# Patient Record
Sex: Male | Born: 1960 | Race: White | Hispanic: No | Marital: Married | Smoking: Former smoker
Health system: Southern US, Community
[De-identification: ages and names within clinical notes are randomized; demographics above are authoritative.]

## PROBLEM LIST (undated history)

## (undated) DIAGNOSIS — R202 Paresthesia of skin: Secondary | ICD-10-CM

## (undated) DIAGNOSIS — B0223 Postherpetic polyneuropathy: Secondary | ICD-10-CM

## (undated) DIAGNOSIS — R200 Anesthesia of skin: Secondary | ICD-10-CM

## (undated) DIAGNOSIS — L0390 Cellulitis, unspecified: Secondary | ICD-10-CM

## (undated) DIAGNOSIS — J329 Chronic sinusitis, unspecified: Secondary | ICD-10-CM

## (undated) DIAGNOSIS — G459 Transient cerebral ischemic attack, unspecified: Secondary | ICD-10-CM

## (undated) HISTORY — DX: Paresthesia of skin: R20.2

## (undated) HISTORY — PX: OTHER SURGICAL HISTORY: SHX169

## (undated) HISTORY — DX: Chronic sinusitis, unspecified: J32.9

## (undated) HISTORY — DX: Anesthesia of skin: R20.0

## (undated) HISTORY — DX: Postherpetic polyneuropathy: B02.23

## (undated) HISTORY — DX: Cellulitis, unspecified: L03.90

## (undated) HISTORY — DX: Transient cerebral ischemic attack, unspecified: G45.9

---

## 2007-01-03 ENCOUNTER — Ambulatory Visit: Payer: Self-pay | Admitting: Internal Medicine

## 2007-02-19 ENCOUNTER — Ambulatory Visit: Payer: Self-pay | Admitting: Internal Medicine

## 2007-05-10 ENCOUNTER — Encounter: Payer: Self-pay | Admitting: Family Medicine

## 2007-05-10 ENCOUNTER — Ambulatory Visit: Payer: Self-pay | Admitting: Internal Medicine

## 2007-05-11 ENCOUNTER — Ambulatory Visit (HOSPITAL_COMMUNITY): Admission: RE | Admit: 2007-05-11 | Discharge: 2007-05-11 | Payer: Self-pay | Admitting: Internal Medicine

## 2007-07-06 ENCOUNTER — Encounter (INDEPENDENT_AMBULATORY_CARE_PROVIDER_SITE_OTHER): Payer: Self-pay | Admitting: Diagnostic Radiology

## 2007-07-06 ENCOUNTER — Ambulatory Visit (HOSPITAL_COMMUNITY): Admission: RE | Admit: 2007-07-06 | Discharge: 2007-07-06 | Payer: Self-pay | Admitting: Internal Medicine

## 2007-12-23 IMAGING — US US ABDOMEN COMPLETE
1 series · 14 of 25 positions shown · non-contrast
Comparison: none

HISTORY: Transaminitis, elevated LFTs

ULTRASOUND ABDOMEN COMPLETE:
TECHNIQUE: Complete abdominal ultrasound examination was performed including
evaluation of the liver, gallbladder, bile ducts, pancreas, kidneys, spleen,
IVC, and abdominal aorta.
Gallbladder normal without stones or wall thickening.
Common bile duct normal caliber, 3 mm diameter.
Liver, pancreas, and spleen normal appearance, spleen measuring 9.7 cm length.
Kidneys normal appearance, right 11.8 cm length and left 11.4 cm.
Aorta and IVC normal.
No ascites.

[Series 1: us abdomen complete · 0.32mm/px · 14 of 60 slices shown]
[im 1/60]
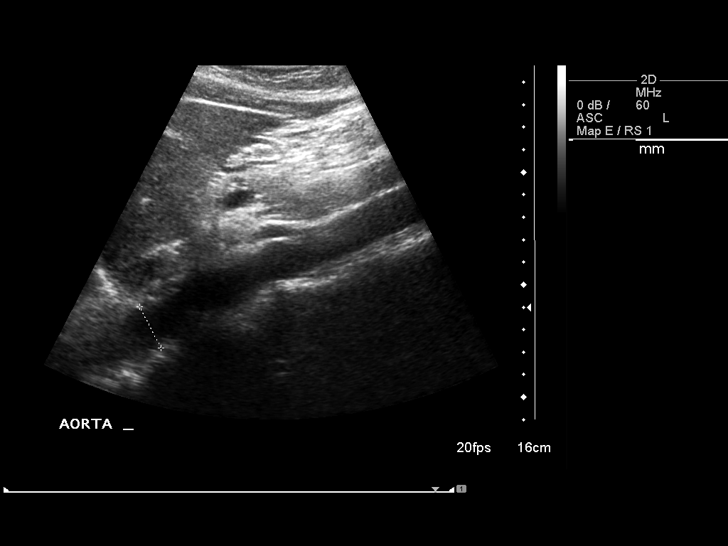
[im 5/60]
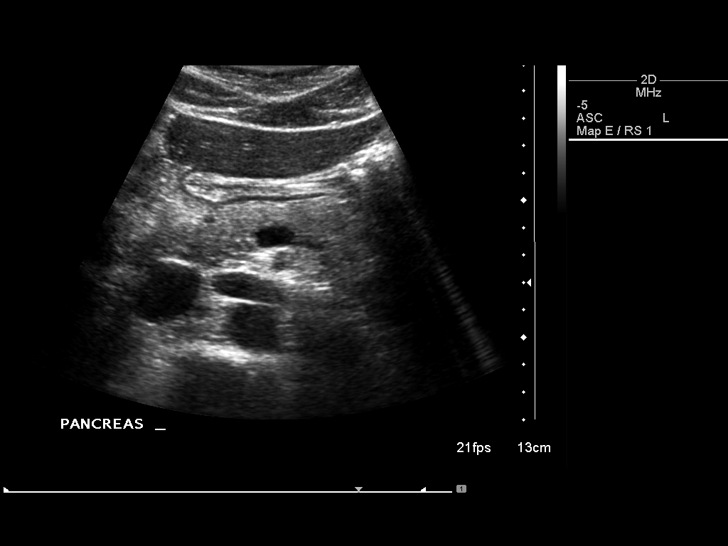
[im 10/60]
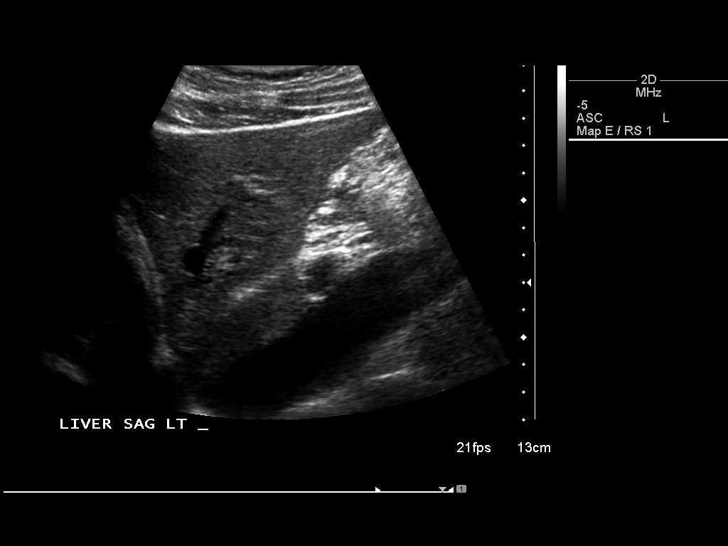
[im 15/60]
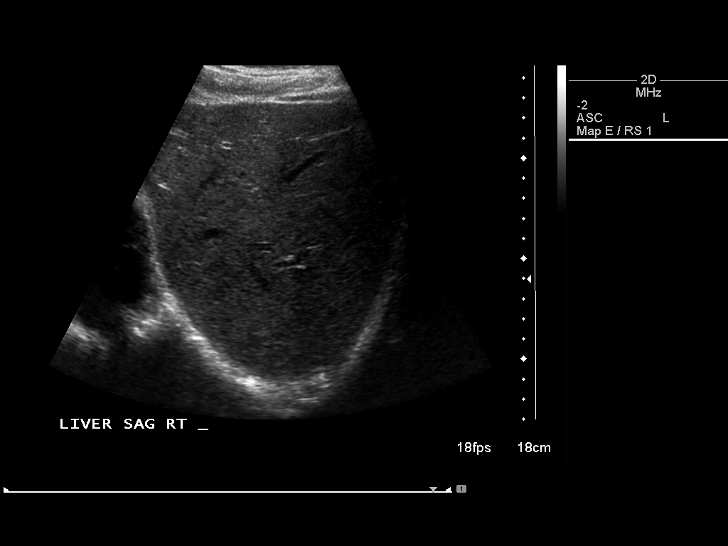
[im 20/60]
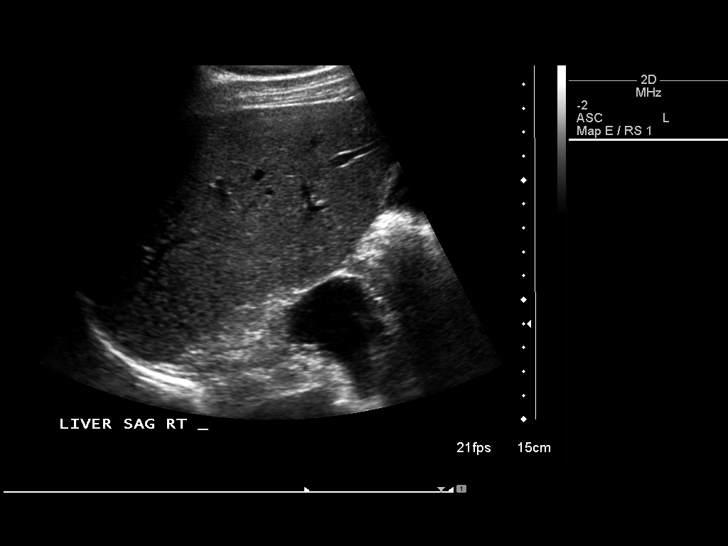
[im 23/60]
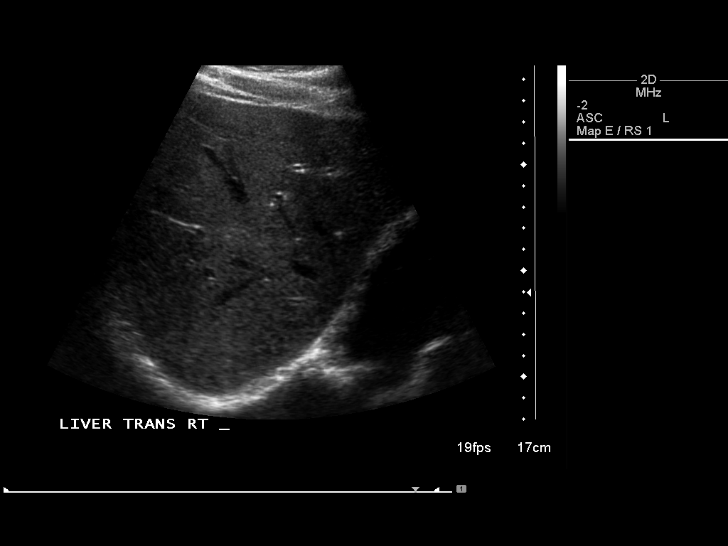
[im 28/60]
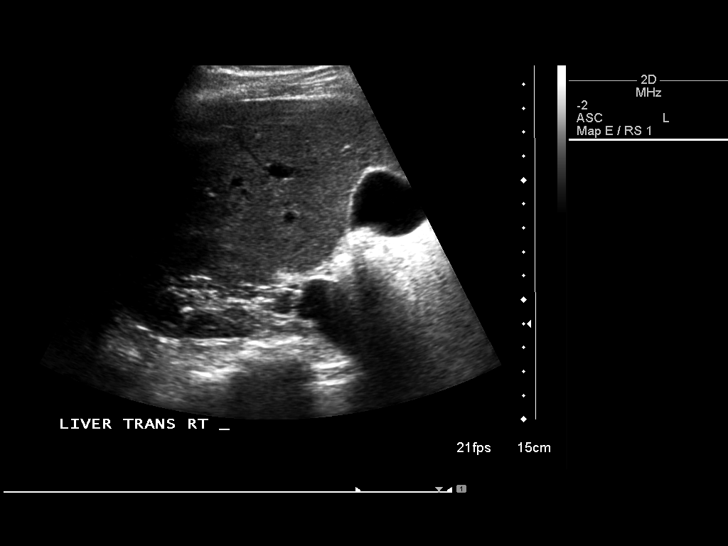
[im 32/60]
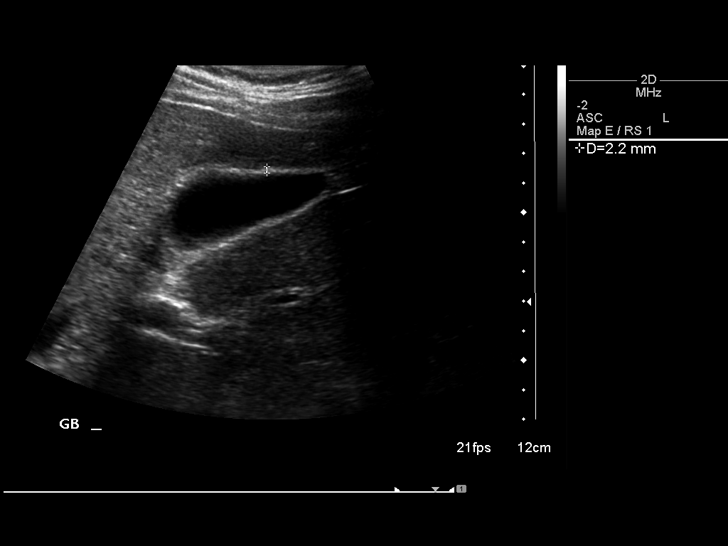
[im 37/60]
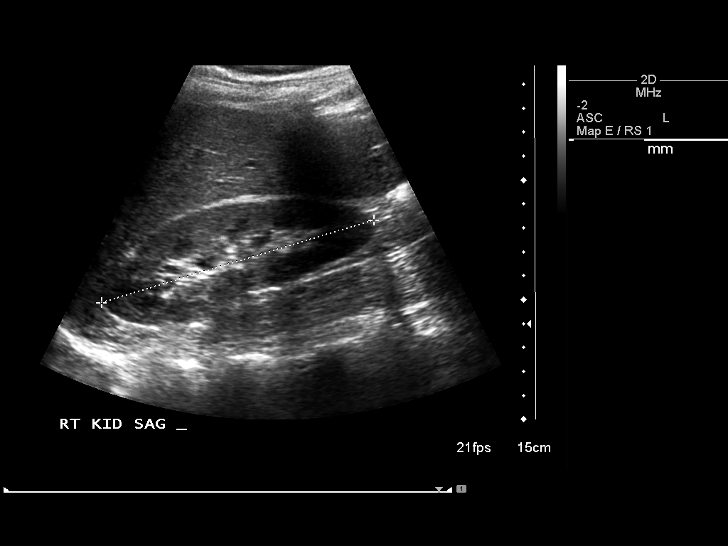
[im 40/60]
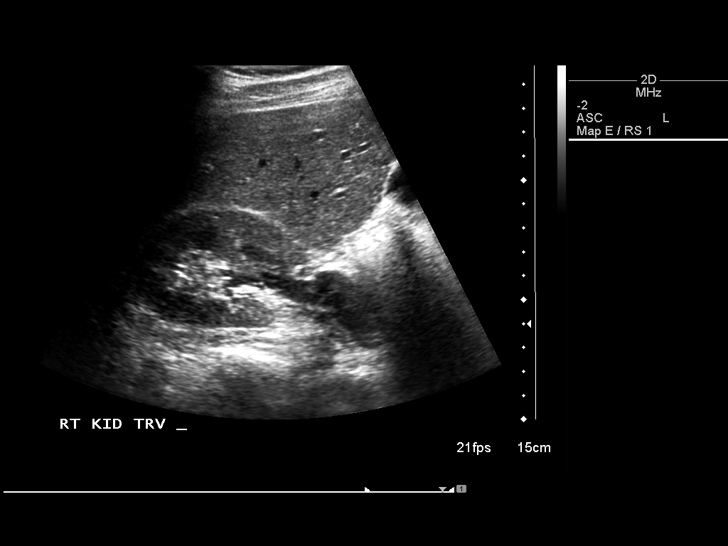
[im 45/60]
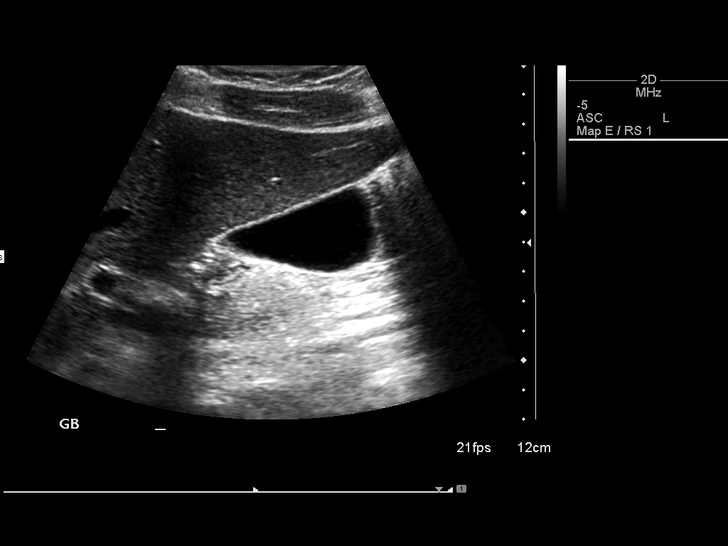
[im 50/60]
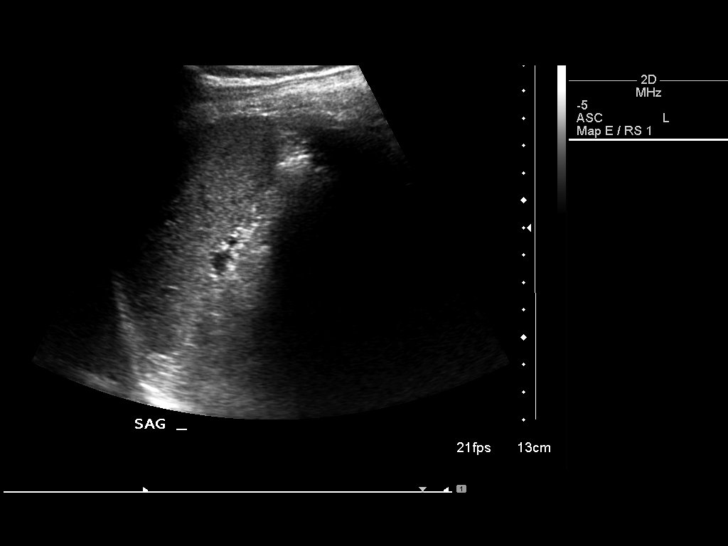
[im 55/60]
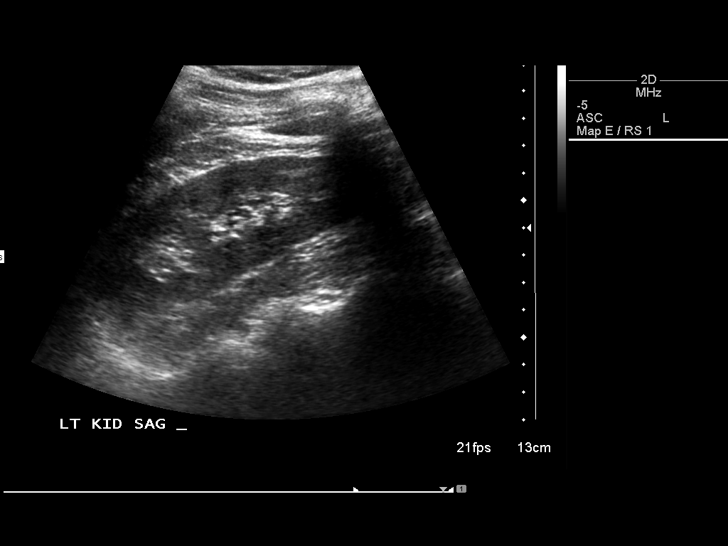
[im 60/60]
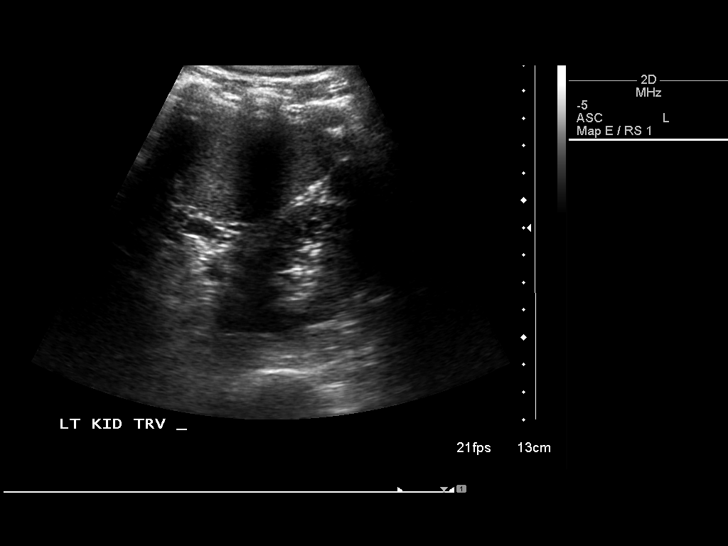

[14 of 25 positions shown; findings below may reference images not displayed]

IMPRESSION: Normal ultrasound abdomen.

## 2008-02-17 IMAGING — US US BIOPSY
1 series · 9 of 9 positions shown · non-contrast
Comparison: none

CLINICAL DATA: Chronically elevated liver functions.  Request has been made for random liver core biopsy.
CLINICAL DATA: Request has been made for random liver core biopsy.
 ULTRASOUND-GUIDED RANDOM LIVER CORE BIOPSY ? 07/06/07: 
 Procedure: The procedure in detail was discussed with the patient and his questions answered.  Potential complications including that of the risk of infection, bleeding, organ damage, and death were discussed with the patient?s apparent understanding.  Written consent was obtained. 
 Ultrasound was used to mark an appropriate skin site. The patient was prepped and draped in the normal sterile fashion and 1% lidocaine was used for local anesthesia.  Through a 17-gauge guiding trocar, three passes were made into the right hepatic lobe with an 18-gauge biopsy needle.  The specimens were sent to the laboratory for further analysis.  Post-imaging of the liver revealed no evidence of bleeding or hematoma.  The patient appeared to tolerate the procedure well with no immediate complications. 
 Cardiac and respiratory monitoring were provided by the radiology nurse throughout the procedure.  Moderate sedation in the form of 3 mg of Versed and 125 mcg of fentanyl was administered over a total sedation time of 15 minutes.

[Series 1: unknown · 0.33mm/px · 9 of 9 slices shown]
[im 1/9]
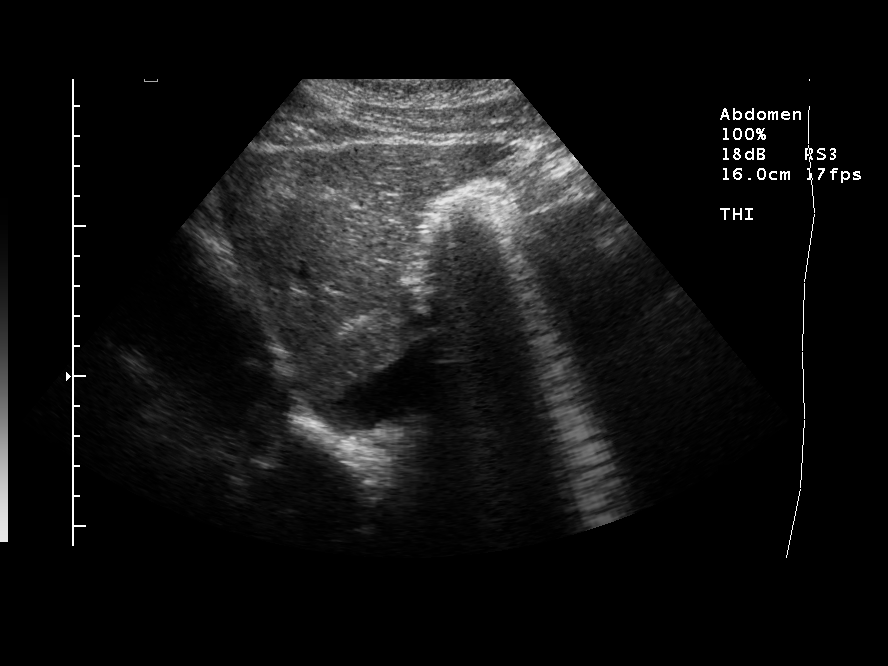
[im 2/9]
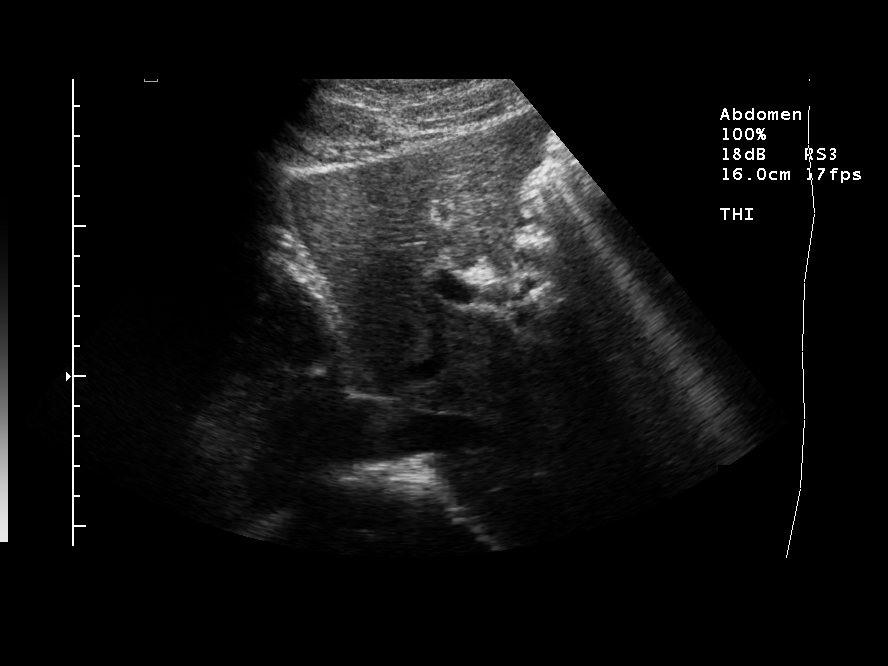
[im 3/9]
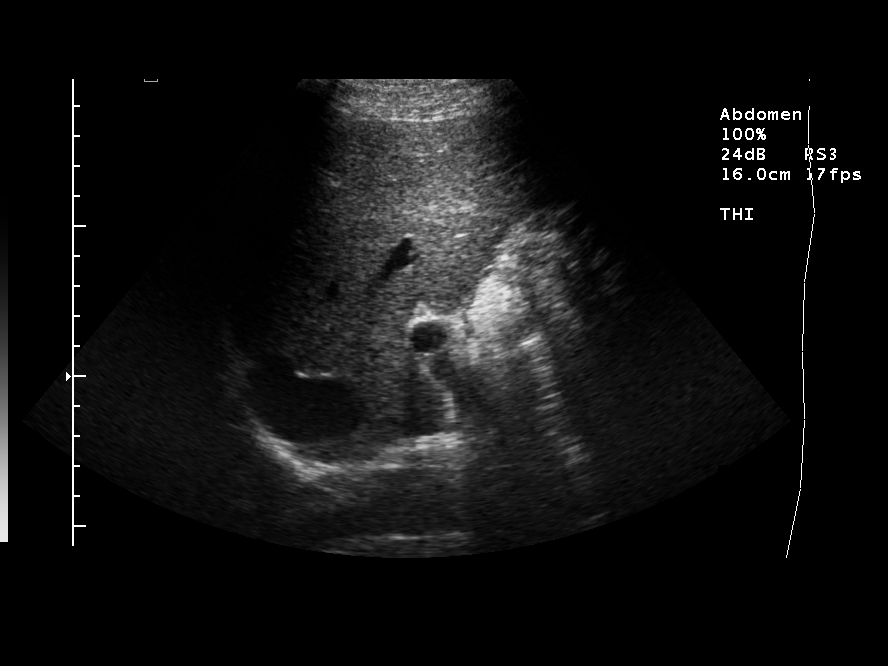
[im 4/9]
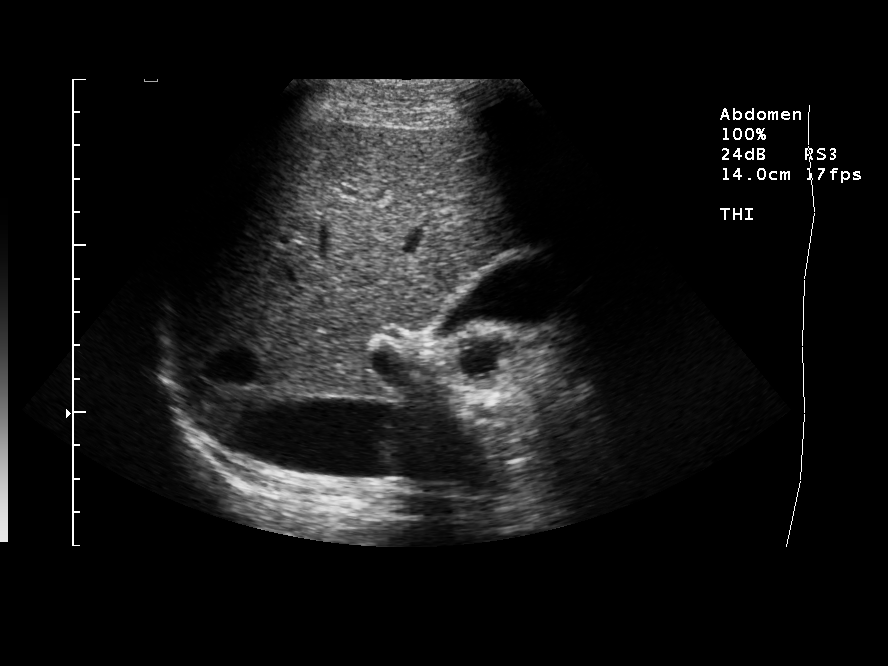
[im 5/9]
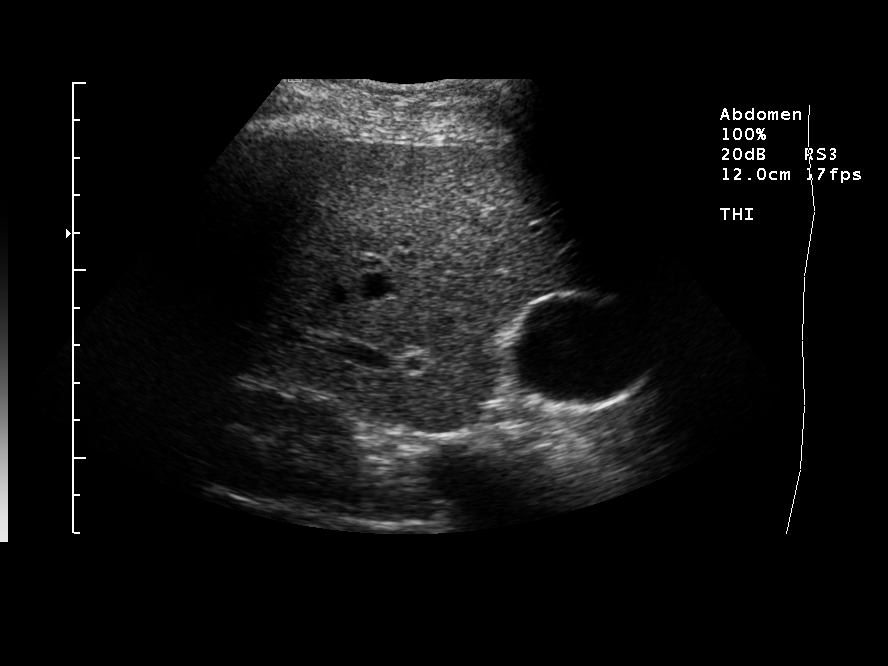
[im 6/9]
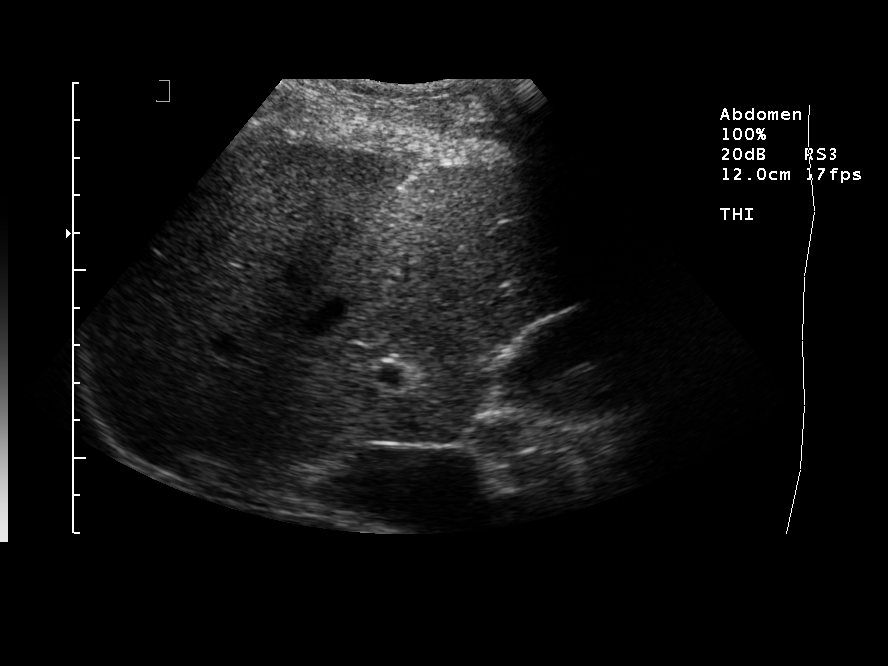
[im 7/9]
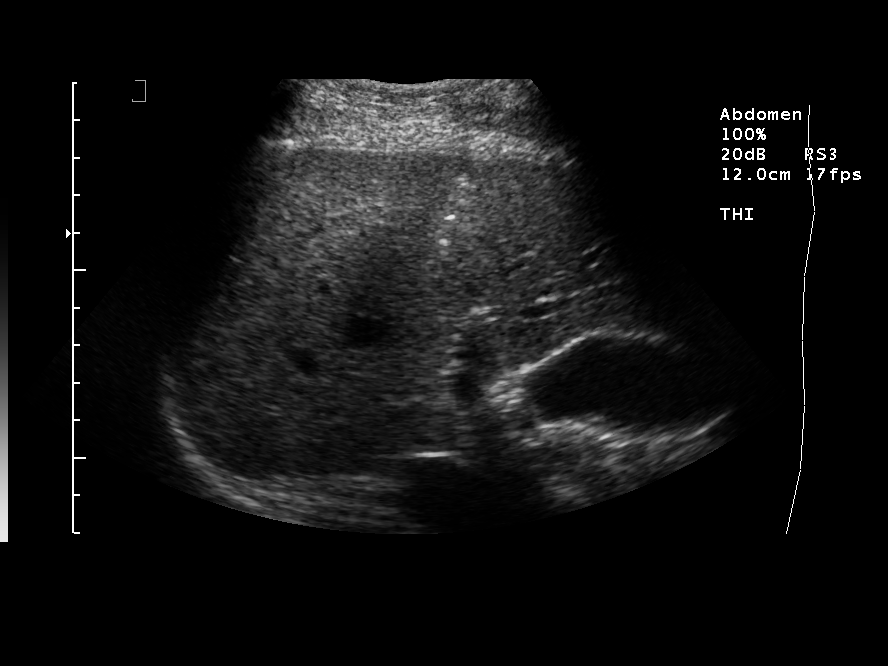
[im 8/9]
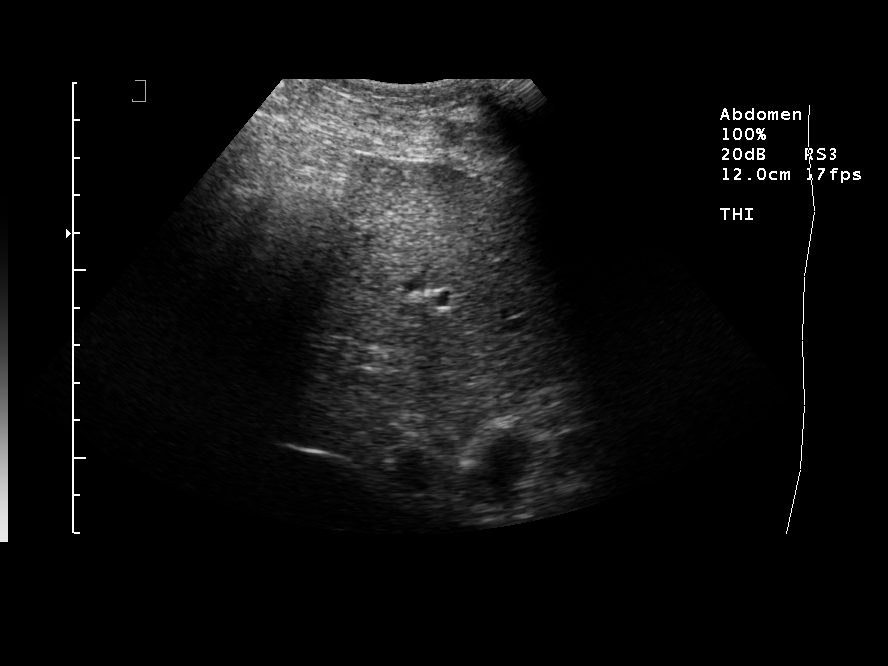
[im 9/9]
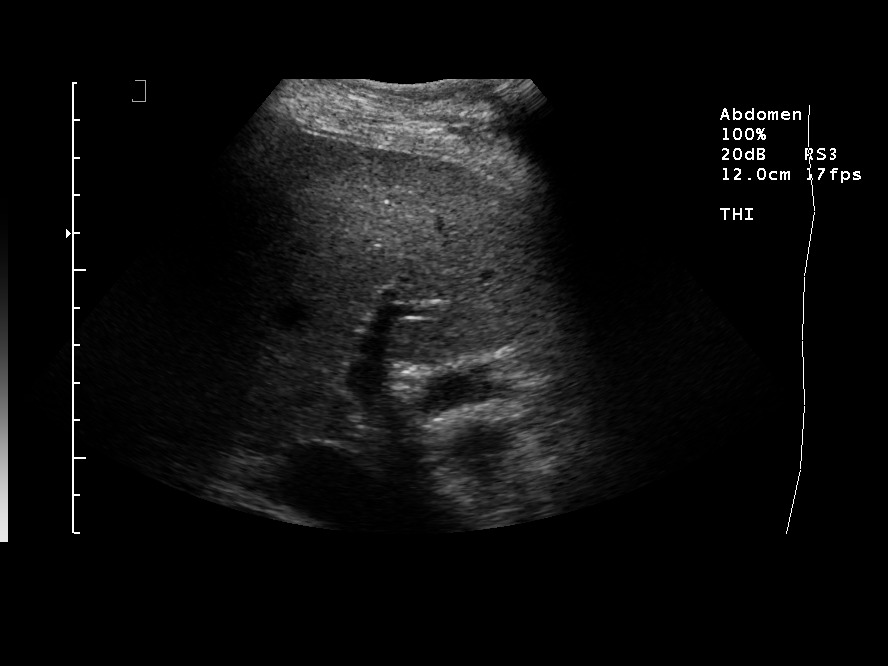

[9 of 9 positions shown; findings below may reference images not displayed]

IMPRESSION: Successful random liver core biopsy under ultrasound guidance.

## 2008-04-18 DIAGNOSIS — Z862 Personal history of diseases of the blood and blood-forming organs and certain disorders involving the immune mechanism: Secondary | ICD-10-CM

## 2008-04-18 DIAGNOSIS — E7800 Pure hypercholesterolemia, unspecified: Secondary | ICD-10-CM

## 2008-04-18 DIAGNOSIS — Z8639 Personal history of other endocrine, nutritional and metabolic disease: Secondary | ICD-10-CM

## 2011-02-01 NOTE — Assessment & Plan Note (Signed)
NAMEJARRYN, ALTLAND                   CHART#:  [id number]   DATE:  02/19/2007                       DOB:  01-23-1961   Followup elevated transaminases, originally seen by [place] on 01/03/2007 for  mildly elevated transaminases.  Hepatic ultrasound demonstrated no space  occupying lesions and normal echo texture.  Hepatitis B, C markers came  back negative through Dr. Dian Situ office.  Iron saturation here came  back at 19%, a ferritin came back at 192.  Hepatic profile looked good  except for an SGPT 1 point above upper limit of normal at 41.  I  recommended one month ago that Mr. Conradt cut back on his alcohol,  ideally stop drinking a few glasses of red wine of which he normally  consumes in the morning and just recheck his LFTs.  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   He has a very healthy lifestyle, he is really not overweight and has got  an excellent lipid profile and he works out regularly.   CURRENT MEDICATIONS:  1. ASA 81 mg daily.  2. Fish oil daily.  3. Advil p.r.n.   ALLERGIES:  NO KNOWN DRUG ALLERGIES.   EXAMINATION:  Today he looks well.  Weight 171, height 5 foot 11,  temperature 97, blood pressure 110/78, and pulse 60.  ABDOMEN:  Flat, positive bowel sounds, soft, nontender without  appreciable hepatosplenomegaly or other abnormality.   ASSESSMENT:  Mild transaminitis in this very nice gentleman.  It is most  likely due to low grade daily consumption of alcohol, this is yet to be  conclusively proven.  I have recommended to Mr. Gorniak that he just  simply cut out wine for the next 6 weeks (consume no alcohol whatsoever  during this period of time) and recheck hepatic profile.  Hopefully we  will see his numbers completely normalized.  If they have not normalized  and he has withdrawn alcohol  from his daily routine then we would need to consider proceeding with  a  liver biopsy.  I will make further recommendations once we see the  hepatic profile in 6 weeks.       Jonathon Bellows, M.D.  Electronically Signed     RMR/MEDQ  D:  02/19/2007  T:  02/19/2007  Job:  [id number]   cc:   Fara Chute

## 2011-02-01 NOTE — Assessment & Plan Note (Signed)
NAMEMAHIN, GUARDIA                   CHART#:  [id number]   DATE:  05/10/2007                       DOB:  Jul 15, 1961   CHIEF COMPLAINT:  Transaminitis.   SUBJECTIVE:  Mr. Polinsky is a 50 year old Caucasian male who was last seen  by Dr. Jena Gauss on February 19, 2007.  He has history of mild transaminitis,  felt possible to be due to his daily alcohol consumption.  He has had  this problem since 2004.  His last ultrasound was in 2004, although he  had told Dr. Jena Gauss previously it was 2 years ago.  The actual scan shows  date of March 17, 2003, which was negative.  Last LFTs, from March 30, 2007, show an SGOT of 44 and SGPT of 60, which had previously been 34  and 41 respectively.  He tells me he decreased his alcohol intake about  50% prior to the last set of LFTs.  He occasionally takes an Advil.  He  had been on aspirin and fish oil previously but he discontinued this.  He denies any other supplements or medications.  He tells me he drinks  about 8-16 oz of wine each night.  He occasionally has a couple of  beers, about once or twice a week.  He denies any consumption of liquor.  He denies any Tylenol consumption.  He denies any pruritus, fever,  chills, jaundice, tri-colored stools or darkening of his urine.  He  denies any anorexia.  His weight has remained stable for years now. Dr.  Jena Gauss had wanted to set Mr. Bushart up for a CT guided liver biopsy at  [facility]; however, Mr. Bache is quite concerned and has  multiple questions regarding this today.   CURRENT MEDICATIONS:  See updated list from May 10, 2007.   ALLERGIES:  No known drug allergies.   OBJECTIVE:  VITAL SIGNS:  Weight 168 lb, height 71 inches, temperature  98 degrees, blood pressure 118/64, pulse 80.  GENERAL:  Mr. Bevill is a well-developed, well-nourished Caucasian male  in no acute distress.  HEENT: Sclera clear, anicteric, conjunctivae pink.  Oropharynx pink and  moist without any lesions.  NECK:  Supple  without thyromegaly.  CHEST:  Heart regular rate and rhythm, no S1, S2.  ABDOMEN:  Positive bowel sounds x4 .  No bruits auscultated, soft,  nontender, nondistended, without palpable mass or hepatosplenomegaly.  No rebound tenderness or guarding.  EXTREMITIES:  Without clubbing or edema bilaterally.  SKIN:  Pink, warm and dry without any rash or jaundice.   ASSESSMENT:  Mr. Kauth is a 50 year old Caucasian male with a now 4 year  history of mild transaminitis.  Previous history of negative viral  hepatitis markers.  He has an ultrasound, which is now 51 years old.  He  is quite concerned about proceeding with CT guided percutaneous liver  biopsy.  He admits to drinking on a daily basis as described in HPI.  I  suspect this may be the culprit of his symptoms; however, cannot confirm  this without liver biopsy.  I have given him the option of proceeding  with liver biopsy versus total abstinence from alcohol for 1 month with  rechecking his LFTs.  He has opted for the latter.  His other concern is  that his ultrasound is 51 years  old, which I think is somewhat of a valid  point.  We can proceed with further imaging at this time to look for  fatty infiltration.   PLAN:  1. Abdominal ultrasound.  2. Abstain from all alcohol for 1 month.  3. LFTs in 1 month.  4. If LFTs remain elevated, he is going to need CT guided percutaneous      liver biopsy and I have discussed this with him in great detail      today.       Lorenza Burton, N.P.  Electronically Signed     R. Roetta Sessions, M.D.  Electronically Signed    KJ/MEDQ  D:  05/10/2007  T:  05/11/2007  Job:  [id number]   cc:   Ellin Saba., MD

## 2011-06-29 LAB — CBC
Platelets: 253
RDW: 12.4
WBC: 3.9 — ABNORMAL LOW

## 2014-03-26 ENCOUNTER — Ambulatory Visit (INDEPENDENT_AMBULATORY_CARE_PROVIDER_SITE_OTHER): Payer: Managed Care, Other (non HMO) | Admitting: Neurology

## 2014-03-26 ENCOUNTER — Encounter: Payer: Self-pay | Admitting: Neurology

## 2014-03-26 ENCOUNTER — Encounter (INDEPENDENT_AMBULATORY_CARE_PROVIDER_SITE_OTHER): Payer: Self-pay

## 2014-03-26 VITALS — BP 131/83 | HR 64 | Ht 71.0 in | Wt 179.0 lb

## 2014-03-26 DIAGNOSIS — G459 Transient cerebral ischemic attack, unspecified: Secondary | ICD-10-CM | POA: Insufficient documentation

## 2014-03-26 DIAGNOSIS — R209 Unspecified disturbances of skin sensation: Secondary | ICD-10-CM

## 2014-03-26 DIAGNOSIS — B0223 Postherpetic polyneuropathy: Secondary | ICD-10-CM | POA: Insufficient documentation

## 2014-03-26 DIAGNOSIS — L0390 Cellulitis, unspecified: Secondary | ICD-10-CM | POA: Insufficient documentation

## 2014-03-26 DIAGNOSIS — R202 Paresthesia of skin: Secondary | ICD-10-CM | POA: Insufficient documentation

## 2014-03-26 DIAGNOSIS — J329 Chronic sinusitis, unspecified: Secondary | ICD-10-CM | POA: Insufficient documentation

## 2014-03-26 DIAGNOSIS — R200 Anesthesia of skin: Secondary | ICD-10-CM | POA: Insufficient documentation

## 2014-03-26 NOTE — Progress Notes (Signed)
PATIENT: Carl Klein DOB: 02/07/1961  HISTORICAL  Carl Klein is a 53 years old right-handed Caucasian male, referred by his primary care physician Dr. Neita CarpSasser for evaluation of episodes of sudden onset strong numbness, still constriction difficulty breathing  He was previously healthy, exercise regularly, running 30 minutes without difficulty, he had 2 recurrent episodes, similar to each other,  About 2 and half weeks ago, he was driving, stopped at a stoplight, he suddenly felt left her jaw numbness, tightness, left shoulder heaviness sensation, tightness in his throat, heart palpation, difficulty breathing, lasting 2-3 minutes,  Very similar episode a week later, while he was sitting down, watching TV, in between episode, he is asymptomatic,  He is now began to take baby aspirin daily, MRI of brain is planned.  REVIEW OF SYSTEMS: Full 14 system review of systems performed and notable only for weight gain, fatigue, ringing in the ears, eye pain, snoring, weakness, passing out  ALLERGIES: No Known Allergies  HOME MEDICATIONS: No current outpatient prescriptions on file prior to visit.   No current facility-administered medications on file prior to visit.    PAST MEDICAL HISTORY: Past Medical History  Diagnosis Date  . Numbness and tingling of left side of face   . TIA (transient ischemic attack)   . Left arm numbness   . Paresthesia   . Shingles (herpes zoster) polyneuropathy   . Cellulitis   . Sinusitis     PAST SURGICAL HISTORY: Past Surgical History  Procedure Laterality Date  . None      FAMILY HISTORY: No family history on file.  SOCIAL HISTORY:  History   Social History  . Marital Status: Married    Spouse Name: Carl StanleyLisa    Number of Children: 1  . Years of Education: college   Occupational History  .  [employer]   Social History Main Topics  . Smoking status: Former Games developermoker  . Smokeless tobacco: Never Used     Comment: Quit in 1982    . Alcohol Use: 1.0 oz/week    2 drink(s) per week     Comment: Wine   . Drug Use: No  . Sexual Activity: Not on file   Other Topics Concern  . Not on file   Social History Narrative   Patient lives at home with his wife Carl Stanley(Klein) Patient works full time at [employer].   Patient has college education.   Right handed.   Caffeine two or three cups daily.     PHYSICAL EXAM   Filed Vitals:   03/26/14 0824  BP: 131/83  Pulse: 64  Height: 5\' 11"  (1.803 m)  Weight: 179 lb (81.194 kg)    Not recorded    Body mass index is 24.98 kg/(m^2).   Generalized: In no acute distress  Neck: Supple, no carotid bruits   Cardiac: Regular rate rhythm  Pulmonary: Clear to auscultation bilaterally  Musculoskeletal: No deformity  Neurological examination  Mentation: Alert oriented to time, place, history taking, and causual conversation  Cranial nerve II-XII: Pupils were equal round reactive to light. Extraocular movements were full.  Visual field were full on confrontational test. Bilateral fundi were sharp.  Facial sensation and strength were normal. Hearing was intact to finger rubbing bilaterally. Uvula tongue midline.  Head turning and shoulder shrug and were normal and symmetric.Tongue protrusion into cheek strength was normal.  Motor: Normal tone, bulk and strength.  Sensory: Intact to fine touch, pinprick, preserved vibratory sensation, and proprioception at toes.  Coordination: Normal finger to nose, heel-to-shin bilaterally there was no truncal ataxia  Gait: Rising up from seated position without assistance, normal stance, without trunk ataxia, moderate stride, good arm swing, smooth turning, able to perform tiptoe, and heel walking without difficulty.   Romberg signs: Negative  Deep tendon reflexes: Brachioradialis 2/2, biceps 2/2, triceps 2/2, patellar 2/2, Achilles 2/2, plantar responses were flexor bilaterally.   DIAGNOSTIC DATA (LABS, IMAGING, TESTING) - I  reviewed patient records, labs, notes, testing and imaging myself where available.  Lab Results  Component Value Date   WBC 3.9* 07/06/2007   HGB 14.7 07/06/2007   HCT 42.9 07/06/2007   MCV 88.3 07/06/2007   PLT 253 07/06/2007    ASSESSMENT AND PLAN  Carl Klein is a 53 y.o. male complains of  recurrent similar episodes of sudden onset left jaw tightness, numbness, left shoulder heaviness, throat tightness, difficulty breathing, lasting 2-3 minutes,  Above symptoms are suggestive of cardiac etiology, remote possibility of TIA . I will refer him to cardiologist. Keep baby aspirin Return to clinic in one month,   Levert FeinsteinYijun Zayne Marovich, M.D. Ph.D.  [facility] [address], [address] [place], [place] [address] [phone]

## 2014-04-02 ENCOUNTER — Ambulatory Visit (INDEPENDENT_AMBULATORY_CARE_PROVIDER_SITE_OTHER): Payer: Managed Care, Other (non HMO) | Admitting: Internal Medicine

## 2014-04-02 ENCOUNTER — Encounter (HOSPITAL_COMMUNITY): Payer: Self-pay | Admitting: *Deleted

## 2014-04-02 ENCOUNTER — Encounter: Payer: Self-pay | Admitting: Internal Medicine

## 2014-04-02 VITALS — BP 118/80 | HR 62 | Ht 71.0 in | Wt 179.7 lb

## 2014-04-02 DIAGNOSIS — R079 Chest pain, unspecified: Secondary | ICD-10-CM

## 2014-04-02 DIAGNOSIS — R200 Anesthesia of skin: Secondary | ICD-10-CM

## 2014-04-02 DIAGNOSIS — R202 Paresthesia of skin: Secondary | ICD-10-CM

## 2014-04-02 DIAGNOSIS — G458 Other transient cerebral ischemic attacks and related syndromes: Secondary | ICD-10-CM

## 2014-04-02 DIAGNOSIS — R209 Unspecified disturbances of skin sensation: Secondary | ICD-10-CM

## 2014-04-02 NOTE — Progress Notes (Signed)
OFFICE NOTE  Chief Complaint:  Left jaw numbness, shoulder pain, ?palpitations  Primary Care Physician: Estanislado PandySASSER,PAUL W, Carl Klein  HPI:  Carl Klein is a pleasant 53 year old male with no significant past medical history. There is no family history of significant coronary disease. He recently had an episode when he was driving of acute onset left jaw numbness and left shoulder discomfort. He also felt that his throat was swelling. It made him somewhat anxious and tachypnea. He denied any chest pain. His symptoms resolved fairly quickly. He says had one other episode when he was sitting at home watching TV and felt a little unusual once when sitting at his dining room table. All the episodes are very short lived. There is no significant provoking or alleviating factors. None of the episodes are associated with any chest tightness. In fact, he exercises fairly regularly. He runs up to 3 miles a couple times a week and recently was at the beach and was running on the beach without any difficulty. He has started on low-dose aspirin has been seen by Dr. Terrace ArabiaYan who kindly referred him from neurology.  When asked about palpitations he denies any associated palpitations with these events although at times sometimes feels his heart races a little. He is under some increased stress recently as his job may be moved out of state and may require a move.  PMHx:  Past Medical History  Diagnosis Date  . Numbness and tingling of left side of face   . TIA (transient ischemic attack)   . Left arm numbness   . Paresthesia   . Shingles (herpes zoster) polyneuropathy   . Cellulitis   . Sinusitis     Past Surgical History  Procedure Laterality Date  . None      FAMHx:  Family History  Problem Relation Age of Onset  . Prostate cancer Father   . Heart disease Father   . Breast cancer Mother     SOCHx:   reports that he quit smoking about 33 years ago. He has never used smokeless tobacco. He reports that he  drinks about 3 - 4 ounces of alcohol per week. He reports that he does not use illicit drugs.  ALLERGIES:  No Known Allergies  ROS: A comprehensive review of systems was negative except for: Cardiovascular: positive for palpitations Neurological: positive for paresthesia  HOME MEDS: Current Outpatient Prescriptions  Medication Sig Dispense Refill  . aspirin 81 MG tablet Take 81 mg by mouth daily.       No current facility-administered medications for this visit.    LABS/IMAGING: No results found for this or any previous visit (from the past 48 hour(s)). No results found.  VITALS: BP 118/80  Pulse 62  Ht 5\' 11"  (1.803 m)  Wt 179 lb 11.2 oz (81.511 kg)  BMI 25.07 kg/m2  EXAM: General appearance: alert and no distress Neck: no carotid bruit and no JVD Lungs: clear to auscultation bilaterally Heart: regular rate and rhythm, S1, S2 normal, no murmur, click, rub or gallop Abdomen: soft, non-tender; bowel sounds normal; no masses,  no organomegaly Extremities: extremities normal, atraumatic, no cyanosis or edema Pulses: 2+ and symmetric Skin: Skin color, texture, turgor normal. No rashes or lesions Neurologic: Grossly normal Psych: Mood, affect normal  EKG: Normal sinus rhythm at 62  ASSESSMENT: 1. Paresthesias, possibly TIA 2. Atypical left shoulder and jaw pain  PLAN: 1.   Carl Klein has had a couple episodes of what appear to be paresthesias and  may be related to TIA. He had a negative MRI of the head as well as CTA of the neck which was negative. He is taking low-dose aspirin now. The symptoms are very unusual for cardiac chest pain. The episodes are sporatic and did not seem to be associated with exertion. He is able to exercise without any limitations.  I think to satisfy his concern, I would recommend a Bruce treadmill stress test to fully evaluate for ischemia. If this is negative, I do not feel further cardiac workup is necessary. Should he have further episodes which  are somewhat closer together. We may consider a monitor to see if we can identify an arrhythmia or something that may be causing these episodes.  Thank you for the kind referral.  Carl Nose, Carl Klein, Carl Klein Attending Cardiologist [facility]  Carl Klein 04/02/2014, 3:15 PM

## 2014-04-02 NOTE — Patient Instructions (Signed)
Your physician has requested that you have an exercise tolerance test. For further information please visit [email or web address]. Please also follow instruction sheet, as given.  Your physician recommends that you schedule a follow-up appointment as needed.   

## 2014-04-10 ENCOUNTER — Telehealth (HOSPITAL_COMMUNITY): Payer: Self-pay

## 2014-04-10 NOTE — Telephone Encounter (Signed)
Encounter complete. 

## 2014-04-11 ENCOUNTER — Telehealth (HOSPITAL_COMMUNITY): Payer: Self-pay

## 2014-04-11 NOTE — Telephone Encounter (Signed)
Encounter complete. 

## 2014-04-15 ENCOUNTER — Ambulatory Visit (HOSPITAL_COMMUNITY)
Admission: RE | Admit: 2014-04-15 | Discharge: 2014-04-15 | Disposition: A | Discharge status: Home / Self Care | Payer: Managed Care, Other (non HMO) | Source: Ambulatory Visit | Attending: Internal Medicine | Admitting: Internal Medicine

## 2014-04-15 DIAGNOSIS — R079 Chest pain, unspecified: Secondary | ICD-10-CM | POA: Insufficient documentation

## 2014-04-15 DIAGNOSIS — Z8249 Family history of ischemic heart disease and other diseases of the circulatory system: Secondary | ICD-10-CM | POA: Insufficient documentation

## 2014-04-15 NOTE — Procedures (Signed)
Exercise Treadmill Test  Pre-Exercise Testing Evaluation NSR at 60 bpm   Test  Exercise Tolerance Test Ordering MD: Chrystie NoseKenneth C. Hilty, MD  Interpreting MD:  Unique Test No:1  Treadmill:  1  Indication for ETT: Family History CAD-Rule Out Ischemia  Contraindication to ETT: Yes   Stress Modality: exercise - treadmill  Cardiac Imaging Performed: non   Protocol: standard Bruce - maximal  Max BP:  198/78  Max MPHR (bpm):  167 85% MPR (bpm):  142  MPHR obtained (bpm):  166 % MPHR obtained: 99  Reached 85% MPHR (min:sec):  9:33 Total Exercise Time (min-sec):  14:36  Workload in METS:17.20   Reason ETT Terminated:  fatigue    ST Segment Analysis At Rest: normal ST segments - no evidence of significant ST depression With Exercise: no evidence of significant ST depression  Other Information Arrhythmia:  No Angina during ETT:  absent (0) Quality of ETT:  diagnostic  ETT Interpretation:  normal - no evidence of ischemia by ST analysis  Comments: Excellent exercise capacity  Thurmon FairMihai Camellia Popescu, MD, [facility] [phone] office [phone] pager

## 2014-04-15 NOTE — Progress Notes (Signed)
LMTCB for test results (normal stress test)

## 2014-04-16 ENCOUNTER — Telehealth: Payer: Self-pay | Admitting: Internal Medicine

## 2014-04-16 NOTE — Telephone Encounter (Signed)
Returning your call. °

## 2014-04-16 NOTE — Telephone Encounter (Signed)
Patient notified of normal stress test.  

## 2014-04-28 ENCOUNTER — Ambulatory Visit: Payer: Managed Care, Other (non HMO) | Admitting: Neurology

## 2014-05-21 ENCOUNTER — Encounter: Payer: Self-pay | Admitting: Neurology

## 2014-05-21 ENCOUNTER — Ambulatory Visit (INDEPENDENT_AMBULATORY_CARE_PROVIDER_SITE_OTHER): Payer: Managed Care, Other (non HMO) | Admitting: Neurology

## 2014-05-21 VITALS — BP 121/79 | HR 53 | Ht 71.0 in | Wt 177.0 lb

## 2014-05-21 DIAGNOSIS — R202 Paresthesia of skin: Secondary | ICD-10-CM

## 2014-05-21 DIAGNOSIS — R209 Unspecified disturbances of skin sensation: Secondary | ICD-10-CM

## 2014-05-21 NOTE — Progress Notes (Signed)
PATIENT: Carl Klein DOB: [birth year]  HISTORICAL  Carl Klein is a 53 years old right-handed Caucasian male, referred by his primary care physician Dr. Neita Carp for evaluation of episodes of sudden onset left jaw numbness,throat constriction sensation, difficulty breathing  He was previously healthy, exercise regularly, running 30 minutes without difficulty, he had 2 recurrent episodes, similar to each other,  About 2 and half weeks ago, in June 2015, he was driving, stopped at a stoplight, he suddenly felt left her jaw numbness, tightness, left shoulder heaviness sensation, tightness in his throat, heart palpation, difficulty breathing, lasting 2-3 minutes,  Very similar episode a week later, while he was sitting down, watching TV, in between episode, he is asymptomatic,  He is now began to take baby aspirin daily, MRI of brain is planned.  UPDATE Sep 2nd 2015: MRI brain was normal. Was seen by Cardiologist Dr. Rennis Golden and Treadmill cardiac test was normal. He is taking aspirin  He admitted that he is under a lot stress. He has no recurrent episode  REVIEW OF SYSTEMS: Full 14 system review of systems performed and notable only for as above  ALLERGIES: No Known Allergies  HOME MEDICATIONS: Current Outpatient Prescriptions on File Prior to Visit  Medication Sig Dispense Refill  . aspirin 81 MG tablet Take 81 mg by mouth daily.       No current facility-administered medications on file prior to visit.    PAST MEDICAL HISTORY: Past Medical History  Diagnosis Date  . Numbness and tingling of left side of face   . TIA (transient ischemic attack)   . Left arm numbness   . Paresthesia   . Shingles (herpes zoster) polyneuropathy   . Cellulitis   . Sinusitis     PAST SURGICAL HISTORY: Past Surgical History  Procedure Laterality Date  . None      FAMILY HISTORY: Family History  Problem Relation Age of Onset  . Prostate cancer Father   . Heart disease Father   .  Breast cancer Mother     SOCIAL HISTORY:  History   Social History  . Marital Status: Married    Spouse Name: Carl Klein    Number of Children: 1  . Years of Education: college   Occupational History  .  [employer]   Social History Main Topics  . Smoking status: Former Games developer  . Smokeless tobacco: Never Used     Comment: Quit in 1982  . Alcohol Use: 1.0 oz/week    2 drink(s) per week     Comment: Wine   . Drug Use: No  . Sexual Activity: Not on file   Other Topics Concern  . Not on file   Social History Narrative   Patient lives at home with his wife Carl Klein) Patient works full time at [employer].   Patient has college education.   Right handed.   Caffeine two or three cups daily.     PHYSICAL EXAM   Filed Vitals:   05/21/14 0813  BP: 121/79  Pulse: 53  Height:  (1.803 m)  Weight: 177 lb (80.287 kg)    Not recorded    Body mass index is 24.7 kg/(m^2).   Generalized: In no acute distress  Neck: Supple, no carotid bruits   Cardiac: Regular rate rhythm  Pulmonary: Clear to auscultation bilaterally  Musculoskeletal: No deformity  Neurological examination  Mentation: Alert oriented to time, place, history taking, and causual conversation  Cranial nerve II-XII: Pupils were equal round  reactive to light. Extraocular movements were full.  Visual field were full on confrontational test. Bilateral fundi were sharp.  Facial sensation and strength were normal. Hearing was intact to finger rubbing bilaterally. Uvula tongue midline.  Head turning and shoulder shrug and were normal and symmetric.Tongue protrusion into cheek strength was normal.  Motor: Normal tone, bulk and strength.  Sensory: Intact to fine touch, pinprick, preserved vibratory sensation, and proprioception at toes.  Coordination: Normal finger to nose, heel-to-shin bilaterally there was no truncal ataxia  Gait: Rising up from seated position without assistance, normal stance,  without trunk ataxia, moderate stride, good arm swing, smooth turning, able to perform tiptoe, and heel walking without difficulty.   Romberg signs: Negative  Deep tendon reflexes: Brachioradialis 2/2, biceps 2/2, triceps 2/2, patellar 2/2, Achilles 2/2, plantar responses were flexor bilaterally.   DIAGNOSTIC DATA (LABS, IMAGING, TESTING) - I reviewed patient records, labs, notes, testing and imaging myself where available.  Lab Results  Component Value Date   WBC 3.9* 07/06/2007   HGB 14.7 07/06/2007   HCT 42.9 07/06/2007   MCV 88.3 07/06/2007   PLT 253 07/06/2007    ASSESSMENT AND PLAN  [name] is a 52 y.o. male complains of 2 recurrent similar episodes of sudden onset left jaw tightness, numbness, left shoulder heaviness, throat tightness, difficulty breathing, lasting 2-3 minutes,  MRI of the brain is normal, no cardiac etiology found, he reported excessive stress recently, the possibility of also including anxiety panic Continue observe symptoms only return to clinic for new issues   Carl Klein, M.D. Ph.D.  [facility] [address], [address] [place], [place] [address] [phone]
